# Patient Record
Sex: Male | Born: 1965 | Race: White | Hispanic: No | Marital: Single | Smoking: Never smoker
Health system: Southern US, Community
[De-identification: ages and names within clinical notes are randomized; demographics above are authoritative.]

## PROBLEM LIST (undated history)

## (undated) HISTORY — PX: CHOLECYSTECTOMY: SHX55

## (undated) HISTORY — PX: HAND SURGERY: SHX662

## (undated) HISTORY — PX: BACK SURGERY: SHX140

## (undated) HISTORY — PX: CLAVICLE SURGERY: SHX598

---

## 2014-08-04 ENCOUNTER — Encounter: Payer: Self-pay | Admitting: Sports Medicine

## 2014-08-04 ENCOUNTER — Ambulatory Visit (INDEPENDENT_AMBULATORY_CARE_PROVIDER_SITE_OTHER): Payer: BLUE CROSS/BLUE SHIELD | Admitting: Sports Medicine

## 2014-08-04 VITALS — BP 146/104 | HR 75 | Ht 71.0 in | Wt 215.0 lb

## 2014-08-04 DIAGNOSIS — M7742 Metatarsalgia, left foot: Secondary | ICD-10-CM

## 2014-08-04 DIAGNOSIS — M7741 Metatarsalgia, right foot: Secondary | ICD-10-CM | POA: Diagnosis not present

## 2014-08-04 DIAGNOSIS — M2141 Flat foot [pes planus] (acquired), right foot: Secondary | ICD-10-CM

## 2014-08-04 DIAGNOSIS — M2142 Flat foot [pes planus] (acquired), left foot: Secondary | ICD-10-CM

## 2014-08-04 NOTE — Progress Notes (Signed)
   Subjective:    Patient ID: Ian Allison, male    DOB: 06/21/1966, 48 y.o.   MRN: [id number]  HPI: Pt presents to clinic for bilateral foot pain at the suggestion of his chiropractor for eval for custom orthotics; he has never had custom orthotics, before. Pt reports he has had foot pain bilaterally and fallen arches "for years," which has been gradually worsening. He reports he has a very large bunion on his left foot which typically does not hurt unless he strikes it on something. He describes sore, sometimes stabbing pain in his feet bilaterally, in the midfoot, worse with walking or standing for long periods. He has tried no specific medications or exercises, but does use OTC shoe inserts with arch supports / built-in metatarsal pads, which do help some, but they wear out quickly. 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 He denies weakness / numbness in his feet, but states at the end of a "good" day that his feet feel like "someone beat them with a baseball bat."  Pt states he has no relevant PMH and takes no other medications. Surgical history notable for C6-C7 fracture fixation, bilateral distal clavical excisions, left thumb amputation and left hand surgery, as well as right leg surgery.  Review of Systems: As above. Generally feels well, otherwise.     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 and overlapping of second toe on weightbearing  No skin breakdown noted but calluses present over metatarsal heads bilaterally, L > R  Mild tenderness to palpation over metatarsal heads, left greater than right  No other  significant tenderness to direct palpation of feet throughout midfoot or over heels, bilaterally  Neurovascular:  Alert, oriented, normal gait / station  Strength 5/5 bilaterally with resisted dorsi- and plantarflexion  Sensation intact bilaterally to light touch and capillary refill normal bilaterally  Gait eval: slight in-toeing with pronation on walking gait, left foot greater than right     Assessment & Plan:  48yo male with bilateral pes planus and metatarsalgia - fitted for custom orthotics, today; see orthotics note below. - f/u in about 1 month for re-eval. Patient may want to have a second pair of orthotics made at that time.  Technical brewerCustom Orthotics Crafting Patient was fitted for a : standard, cushioned, semi-rigid orthotic. The orthotic was heated and afterward the patient stood on the orthotic blank positioned on the orthotic stand. The patient was positioned in subtalar neutral position and 10 degrees of ankle dorsiflexion in a weight bearing stance. After completion of molding, a stable base was applied to the orthotic blank. The blank was ground to a stable position for weight bearing. Size: 12 Base: blue EVA Posting: none Additional orthotic padding: bilateral metatarsal pads  Total time spent with the patient was 30 minutes with greater than 50% of the time spent in face to face consultation discussing orthotic construction, fitting, and instruction.  Bobbye Mortonhristopher M Amiliana Foutz, MD PGY-3, [facility] 08/04/2014, 3:54 PM

## 2014-09-03 ENCOUNTER — Ambulatory Visit (INDEPENDENT_AMBULATORY_CARE_PROVIDER_SITE_OTHER): Payer: BLUE CROSS/BLUE SHIELD | Admitting: Sports Medicine

## 2014-09-03 ENCOUNTER — Encounter: Payer: Self-pay | Admitting: Sports Medicine

## 2014-09-03 VITALS — BP 139/88 | HR 75

## 2014-09-03 DIAGNOSIS — M2142 Flat foot [pes planus] (acquired), left foot: Secondary | ICD-10-CM | POA: Diagnosis not present

## 2014-09-03 DIAGNOSIS — M2141 Flat foot [pes planus] (acquired), right foot: Secondary | ICD-10-CM | POA: Diagnosis not present

## 2014-09-04 NOTE — Progress Notes (Signed)
Patient ID: Ian Allison, male   DOB: 08/09/1966, 48 y.o.   MRN: [id number]  Patient comes in today for follow-up. Unfortunately he has found his custom orthotics to be uncomfortable. He is experiencing pain along the arch of each foot, left greater than right which she believes is due to the orthotics "not providing enough support". However, the orthotic has plenty of support and I think his discomfort may be coming from the orthotic itself. He has a pair of more flimsy orthotics which he has been wearing and finds them to be more comfortable and he believes that they provide more arch support than the custom orthotics we made. Therefore, I think we should try a pair of green sports insoles with scaphoid pads and metatarsal pads. We initially placed the scaphoid pad underneath the green insert but he felt like he wasn't getting enough support with this so we simply change this and added the scaphoid pad to the top of the insert. He found this to be much more comfortable and felt like it provided him with better support. He was also complaining that he could not get his custom orthotics into his other pairs of shoes which should not be the case with the more flexible green inserts. He will follow-up with me again in 3 weeks but I have encouraged him to call sooner if he continues to have problems.  Total time spent with the patient was 20 minutes with greater than 50% of the time spent in face-to-face consultation and orthotic revision.

## 2014-09-30 ENCOUNTER — Ambulatory Visit: Payer: BC Managed Care – PPO | Admitting: Sports Medicine

## 2015-06-11 ENCOUNTER — Encounter: Payer: Self-pay | Admitting: *Deleted

## 2015-06-11 ENCOUNTER — Emergency Department (INDEPENDENT_AMBULATORY_CARE_PROVIDER_SITE_OTHER): Payer: BLUE CROSS/BLUE SHIELD

## 2015-06-11 ENCOUNTER — Emergency Department (INDEPENDENT_AMBULATORY_CARE_PROVIDER_SITE_OTHER)
Admission: EM | Admit: 2015-06-11 | Discharge: 2015-06-11 | Disposition: A | Discharge status: Home / Self Care | Payer: BLUE CROSS/BLUE SHIELD | Source: Home / Self Care | Attending: Family Medicine | Admitting: Family Medicine

## 2015-06-11 DIAGNOSIS — S60221A Contusion of right hand, initial encounter: Secondary | ICD-10-CM

## 2015-06-11 DIAGNOSIS — S61411A Laceration without foreign body of right hand, initial encounter: Secondary | ICD-10-CM | POA: Diagnosis not present

## 2015-06-11 DIAGNOSIS — Z23 Encounter for immunization: Secondary | ICD-10-CM | POA: Diagnosis not present

## 2015-06-11 DIAGNOSIS — M79641 Pain in right hand: Secondary | ICD-10-CM | POA: Diagnosis not present

## 2015-06-11 MED ORDER — CEPHALEXIN 500 MG PO CAPS: 500.0000 mg | ORAL_CAPSULE | Freq: Three times a day (TID) | ORAL | Status: AC

## 2015-06-11 MED ORDER — HYDROCODONE-ACETAMINOPHEN 5-325 MG PO TABS: 1.0000 | ORAL_TABLET | Freq: Four times a day (QID) | ORAL | Status: AC | PRN

## 2015-06-11 MED ORDER — TETANUS-DIPHTH-ACELL PERTUSSIS 5-2.5-18.5 LF-MCG/0.5 IM SUSP
0.5000 mL | Freq: Once | INTRAMUSCULAR | Status: AC
  Administered 2015-06-11: 0.5 mL via INTRAMUSCULAR

## 2015-06-11 NOTE — Discharge Instructions (Signed)
Change dressing daily and apply Bacitracin ointment to wound.  Keep wound clean and dry.  Return for any signs of infection (or follow-up with family doctor):  Increasing redness, swelling, pain, heat, drainage, etc. Return in 12 days for suture removal.  Wear ace wrap on right hand daily until swelling resolves.  Elevate hand.   May take Ibuprofen , 4 tabs every 8 hours with food as needed for pain.  Apply ice pack to top of hand for 15 to 20 minutes, 3 to 4 times daily  Continue until swelling decreases.    Laceration Care, Adult A laceration is a cut or lesion that goes through all layers of the skin and into the tissue just beneath the skin. TREATMENT  Some lacerations may not require closure. Some lacerations may not be able to be closed due to an increased risk of infection. It is important to see your caregiver as soon as possible after an injury to minimize the risk of infection and maximize the opportunity for successful closure. If closure is appropriate, pain medicines may be given, if needed. The wound will be cleaned to help prevent infection. Your caregiver will use stitches (sutures), staples, wound glue (adhesive), or skin adhesive strips to repair the laceration. These tools bring the skin edges together to allow for faster healing and a better cosmetic outcome. However, all wounds will heal with a scar. Once the wound has healed, scarring can be minimized by covering the wound with sunscreen during the day for 1 full year. HOME CARE INSTRUCTIONS  For sutures or staples:  Keep the wound clean and dry.  If you were given a bandage (dressing), you should change it at least once a day. Also, change the dressing if it becomes wet or dirty, or as directed by your caregiver.  Wash the wound with soap and water 2 times a day. Rinse the wound off with water to remove all soap. Pat the wound dry with a clean towel.  After cleaning, apply a thin layer of the antibiotic ointment as  recommended by your caregiver. This will help prevent infection and keep the dressing from sticking.  You may shower as usual after the first 24 hours. Do not soak the wound in water until the sutures are removed.  Only take over-the-counter or prescription medicines for pain, discomfort, or fever as directed by your caregiver.  Get your sutures or staples removed as directed by your caregiver. For skin adhesive strips:  Keep the wound clean and dry.  Do not get the skin adhesive strips wet. You may bathe carefully, using caution to keep the wound dry.  If the wound gets wet, pat it dry with a clean towel.  Skin adhesive strips will fall off on their own. You may trim the strips as the wound heals. Do not remove skin adhesive strips that are still stuck to the wound. They will fall off in time. For wound adhesive:  You may briefly wet your wound in the shower or bath. Do not soak or scrub the wound. Do not swim. Avoid periods of heavy perspiration until the skin adhesive has fallen off on its own. After showering or bathing, gently pat the wound dry with a clean towel.  Do not apply liquid medicine, cream medicine, or ointment medicine to your wound while the skin adhesive is in place. This may loosen the film before your wound is healed.  If a dressing is placed over the wound, be careful not to apply tape  directly over the skin adhesive. This may cause the adhesive to be pulled off before the wound is healed.  Avoid prolonged exposure to sunlight or tanning lamps while the skin adhesive is in place. Exposure to ultraviolet light in the first year will darken the scar.  The skin adhesive will usually remain in place for 5 to 10 days, then naturally fall off the skin. Do not pick at the adhesive film. You may need a tetanus shot if:  You cannot remember when you had your last tetanus shot.  You have never had a tetanus shot. If you get a tetanus shot, your arm may swell, get red, and  feel warm to the touch. This is common and not a problem. If you need a tetanus shot and you choose not to have one, there is a rare chance of getting tetanus. Sickness from tetanus can be serious. SEEK MEDICAL CARE IF:   You have redness, swelling, or increasing pain in the wound.  You see a red line that goes away from the wound.  You have yellowish-white fluid (pus) coming from the wound.  You have a fever.  You notice a bad smell coming from the wound or dressing.  Your wound breaks open before or after sutures have been removed.  You notice something coming out of the wound such as wood or glass.  Your wound is on your hand or foot and you cannot move a finger or toe. SEEK IMMEDIATE MEDICAL CARE IF:   Your pain is not controlled with prescribed medicine.  You have severe swelling around the wound causing pain and numbness or a change in color in your arm, hand, leg, or foot.  Your wound splits open and starts bleeding.  You have worsening numbness, weakness, or loss of function of any joint around or beyond the wound.  You develop painful lumps near the wound or on the skin anywhere on your body. MAKE SURE YOU:   Understand these instructions.  Will watch your condition.  Will get help right away if you are not doing well or get worse. Document Released: 09/19/2005 Document Revised: 12/12/2011 Document Reviewed: 03/15/2011 [facility] Patient Information 2015 [place], [place]. This information is not intended to replace advice given to you by your health care provider. Make sure you discuss any questions you have with your health care provider.

## 2015-06-11 NOTE — ED Notes (Signed)
Pt reports cutting right hand on tractor blades while sharpening about 1 hour ago. Bleeding has stopped, cleaned with Hibiclens.

## 2015-06-11 NOTE — ED Provider Notes (Signed)
CSN: [id number]     Arrival date & time 06/11/15  1949 History   First MD Initiated Contact with Patient 06/11/15 1951     Chief Complaint  Patient presents with  . Laceration      HPI Comments: Patient was using a rotary grinder to sharpen a blade on his tractor mounted rotary mower.  He lost control of the grinder and the dorsum of his right hand struck the mower housing, while the mower blade lacerated his palm.  Patient is a 49 y.o. male presenting with skin laceration. The history is provided by the patient.  Laceration Location:  Hand Hand laceration location:  R palm Length (cm):  3.5 Depth:  Through underlying tissue (flap) Bleeding: controlled   Time since incident:  1 hour Laceration mechanism:  Metal edge Pain details:    Quality:  Aching   Severity:  Mild   Timing:  Constant   Progression:  Unchanged Foreign body present:  No foreign bodies Worsened by:  Movement Ineffective treatments:  None tried Tetanus status:  Out of date   History reviewed. No pertinent past medical history. Past Surgical History  Procedure Laterality Date  . Cholecystectomy    . Back surgery    . Clavicle surgery    . Hand surgery     History reviewed. No pertinent family history. Social History  Substance Use Topics  . Smoking status: Never Smoker   . Smokeless tobacco: None  . Alcohol Use: 0.0 oz/week    0 Standard drinks or equivalent per week    Review of Systems  All other systems reviewed and are negative.   Allergies  Review of patient's allergies indicates no known allergies.  Home Medications   Prior to Admission medications   Medication Sig Start Date End Date Taking? Authorizing Provider  ALPRAZolam Prudy Feeler) 1 MG tablet Take 1 mg by mouth. 09/01/14   Historical Provider, MD  amphetamine-dextroamphetamine (ADDERALL) 20 MG tablet Take 10 mg by mouth. 09/01/14   Historical Provider, MD  cephALEXin (KEFLEX) 500 MG capsule Take 1 capsule (500 mg total) by mouth 3  (three) times daily. 06/11/15   Lattie Haw, MD  cetirizine (ZYRTEC) 10 MG tablet Take 10 mg by mouth.    Historical Provider, MD  HYDROcodone-acetaminophen (NORCO/VICODIN) 5-325 MG per tablet Take 1 tablet by mouth every 6 (six) hours as needed for moderate pain. 06/11/15   Lattie Haw, MD  Multiple Vitamin (MULTIVITAMIN) capsule Take 1 capsule by mouth daily.    Historical Provider, MD  sildenafil (REVATIO) 20 MG tablet Take 3-5 tablets by mouth daily as needed for ED. 11/14/13   Historical Provider, MD   Meds Ordered and Administered this Visit   Medications  Tdap (BOOSTRIX) injection 0.5 mL (0.5 mLs Intramuscular Given 06/11/15 2016)    BP 145/97 mmHg  Pulse 112  Temp(Src) 98.8 F (37.1 C) (Oral)  Resp 16  SpO2 95% No data found.   Physical Exam  Constitutional: He is oriented to person, place, and time. He appears well-developed and well-nourished. No distress.  HENT:  Head: Atraumatic.  Eyes: Conjunctivae are normal. Pupils are equal, round, and reactive to light.  Musculoskeletal:       Right hand: He exhibits decreased range of motion, tenderness, bony tenderness, laceration and swelling. He exhibits normal two-point discrimination, normal capillary refill and no deformity. Normal sensation noted.       Hands: Right hand has curved 3.5cm flap laceration over the thenar eminence. Dorsum of right hand  has mild swelling and diffuse tenderness to palpation.  No ecchymosis.  Fingers have full range of motion and flexion/extension intact.  Distal neurovascular function is intact.   Neurological: He is alert and oriented to person, place, and time.  Skin: Skin is warm and dry.  Nursing note and vitals reviewed.   ED Course  Procedures Laceration Repair Discussed benefits and risks of procedure and verbal consent obtained. Using sterile technique and local 1% lidocaine without epinephrine, cleansed wound with Betadine followed by copious lavage with normal saline.  Wound  carefully inspected for debris and foreign bodies; none found.  Wound closed with #10, 4-0 interrupted nylon sutures.  Bacitracin and non-stick sterile dressing applied.  Wound precautions explained to patient.  Return for suture removal in 12 days.       Imaging Review Dg Hand Complete Right  06/11/2015   CLINICAL DATA:  Pt. Cut hand on tractor blades. Contusion to dorsum of RIGHT hand along with a deep laceration on the palmar surface around the 1st metacarpal.  EXAM: RIGHT HAND - COMPLETE 3+ VIEW  COMPARISON:  None.  FINDINGS: Evidence of prior fracture fifth metacarpal. No acute fracture or dislocation. No radiodense foreign body over the first digit.  IMPRESSION: No acute abnormalities   Electronically Signed   By: Esperanza Heir M.D.   On: 06/11/2015 20:22      MDM   1. Laceration of right hand, initial encounter   2. Contusion, hand, right, initial encounter    Tdap administered Ace wrap applied to left hand.  Begin empiric Keflex 500mg  TID.  Rx for Lortab. Change dressing daily and apply Bacitracin ointment to wound.  Keep wound clean and dry.  Return for any signs of infection (or follow-up with family doctor):  Increasing redness, swelling, pain, heat, drainage, etc. Return in 12 days for suture removal.  Wear ace wrap on right hand daily until swelling resolves.  Elevate hand.   May take Ibuprofen 200mg , 4 tabs every 8 hours with food as needed for pain.  Apply ice pack to top of hand for 15 to 20 minutes, 3 to 4 times daily  Continue until swelling decreases.    Lattie Haw, MD 06/13/15 [phone]

## 2015-06-14 ENCOUNTER — Telehealth: Payer: Self-pay | Admitting: Emergency Medicine

## 2015-06-25 ENCOUNTER — Emergency Department (INDEPENDENT_AMBULATORY_CARE_PROVIDER_SITE_OTHER)
Admission: EM | Admit: 2015-06-25 | Discharge: 2015-06-25 | Disposition: A | Discharge status: Home / Self Care | Payer: BLUE CROSS/BLUE SHIELD | Source: Home / Self Care | Attending: Family Medicine | Admitting: Family Medicine

## 2015-06-25 ENCOUNTER — Encounter: Payer: Self-pay | Admitting: Emergency Medicine

## 2015-06-25 DIAGNOSIS — Z4802 Encounter for removal of sutures: Secondary | ICD-10-CM

## 2015-06-25 NOTE — ED Notes (Signed)
Suture removal put in 2 weeks ago right hand no signs of infection

## 2015-06-25 NOTE — ED Provider Notes (Signed)
CSN: [id number]     Arrival date & time 06/25/15  1258 History   First MD Initiated Contact with Patient 06/25/15 1316     Chief Complaint  Patient presents with  . Suture / Staple Removal      HPI Comments: Patient returns for suture removal without complaints.  The history is provided by the patient.    History reviewed. No pertinent past medical history. Past Surgical History  Procedure Laterality Date  . Cholecystectomy    . Back surgery    . Clavicle surgery    . Hand surgery     No family history on file. Social History  Substance Use Topics  . Smoking status: Never Smoker   . Smokeless tobacco: None  . Alcohol Use: 0.0 oz/week    0 Standard drinks or equivalent per week    Review of Systems No fever.  No hand pain or swelling Allergies  Review of patient's allergies indicates not on file.  Home Medications   Prior to Admission medications   Medication Sig Start Date End Date Taking? Authorizing Provider  ALPRAZolam Prudy Feeler) 1 MG tablet Take 1 mg by mouth. 09/01/14   Historical Provider, MD  amphetamine-dextroamphetamine (ADDERALL) 20 MG tablet Take 10 mg by mouth. 09/01/14   Historical Provider, MD  cephALEXin (KEFLEX) 500 MG capsule Take 1 capsule (500 mg total) by mouth 3 (three) times daily. 06/11/15   Lattie Haw, MD  cetirizine (ZYRTEC) 10 MG tablet Take 10 mg by mouth.    Historical Provider, MD  HYDROcodone-acetaminophen (NORCO/VICODIN) 5-325 MG per tablet Take 1 tablet by mouth every 6 (six) hours as needed for moderate pain. 06/11/15   Lattie Haw, MD  Multiple Vitamin (MULTIVITAMIN) capsule Take 1 capsule by mouth daily.    Historical Provider, MD  sildenafil (REVATIO) 20 MG tablet Take 3-5 tablets by mouth daily as needed for ED. 11/14/13   Historical Provider, MD   Meds Ordered and Administered this Visit  Medications - No data to display  BP 137/94 mmHg  Pulse 75  Temp(Src) 97.9 F (36.6 C) (Oral)  SpO2 99% No data found.   Physical  Exam Right hand:  Healed laceration without swelling, drainage, tenderness, or erythema ED Course  Procedures  None   MDM   1. Visit for suture removal    Well healed laceration without evidence infection. Advised to use caution as scar tissue matures.     Lattie Haw, MD 06/25/15 [phone]

## 2015-06-25 NOTE — Discharge Instructions (Signed)

## 2016-12-07 IMAGING — CR DG HAND COMPLETE 3+V*R*
3 series · 3 of 3 positions shown · non-contrast
Comparison: None.

CLINICAL DATA: Pt. Cut hand on tractor blades. Contusion to dorsum
of RIGHT hand along with a deep laceration on the palmar surface
around the 1st metacarpal.

EXAM:
RIGHT HAND - COMPLETE 3+ VIEW

[hand pa]
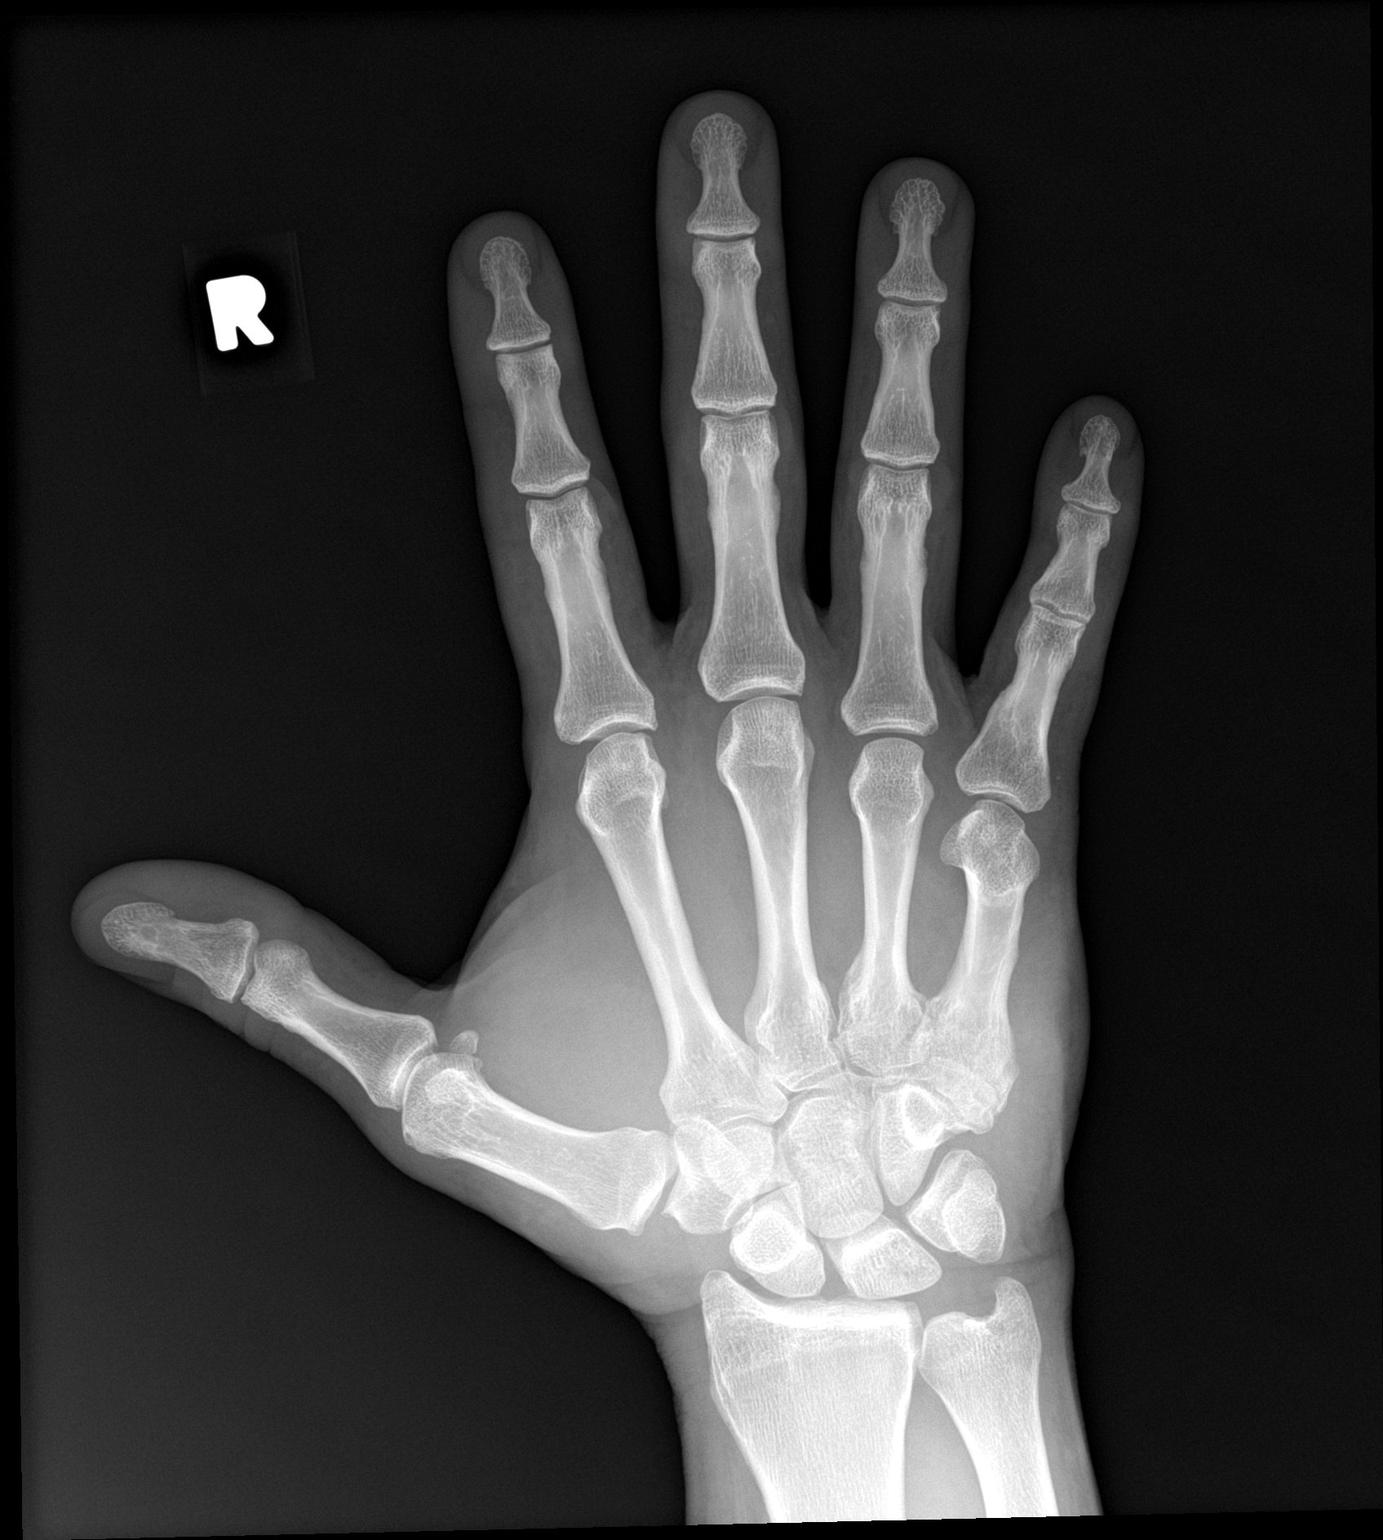

[hand obl]
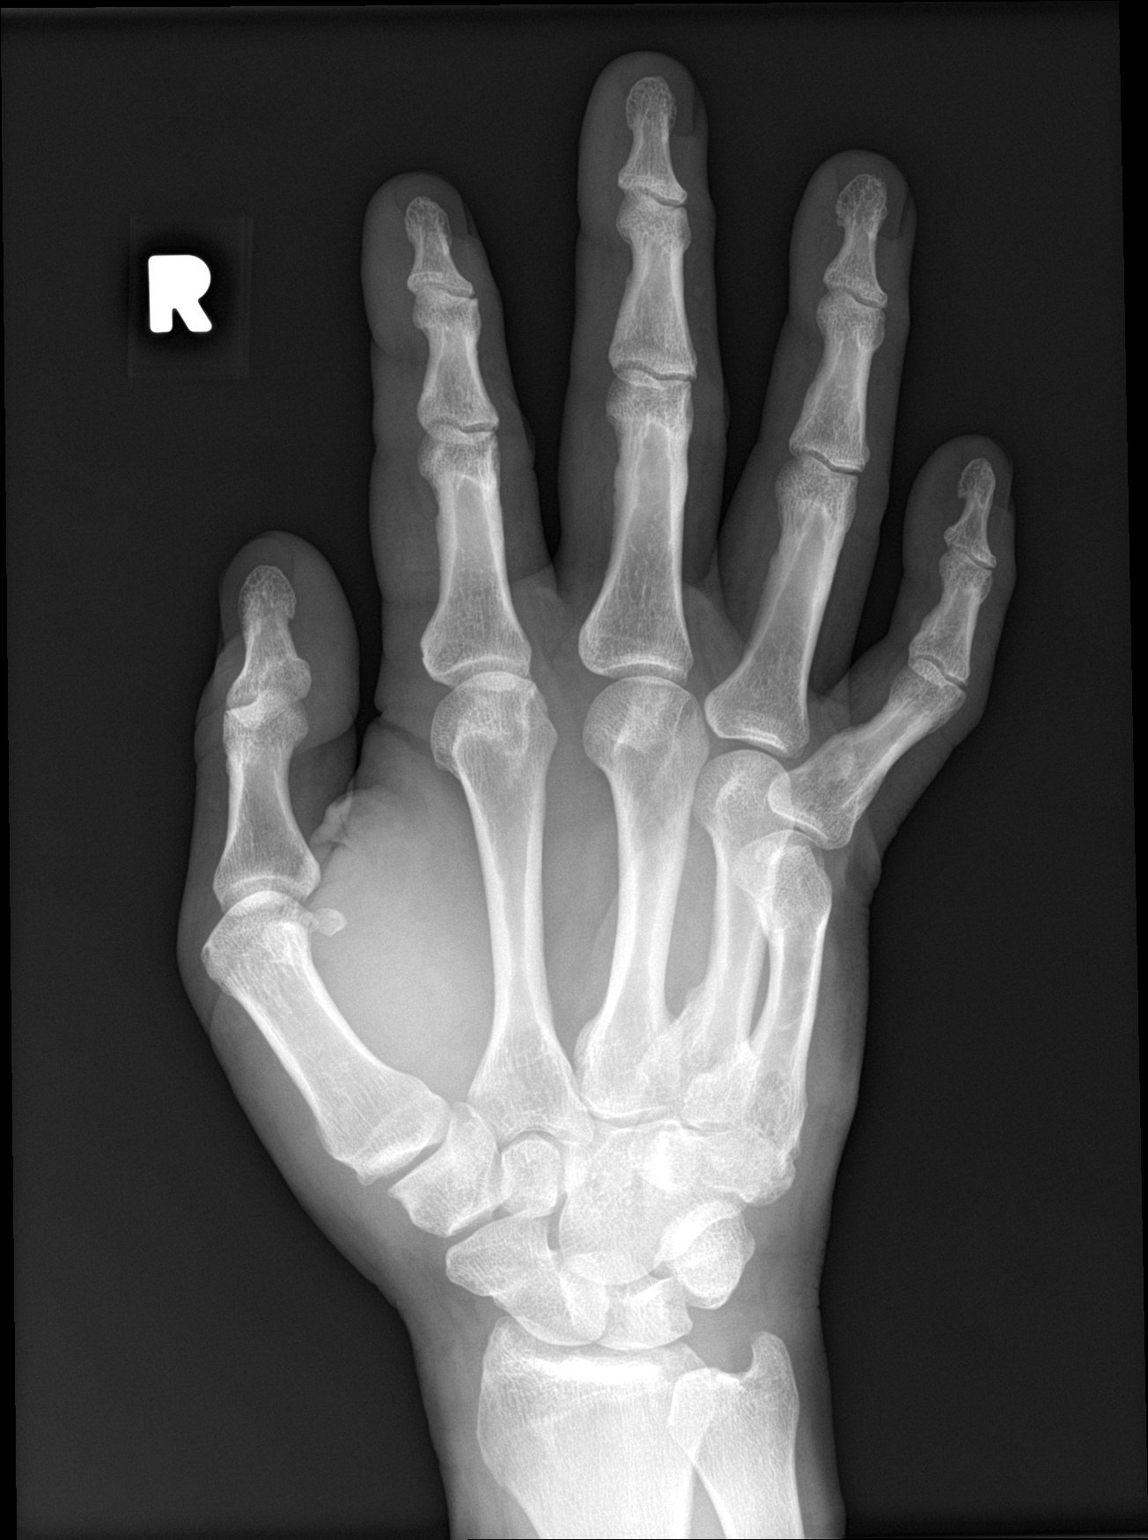

[hand lat]
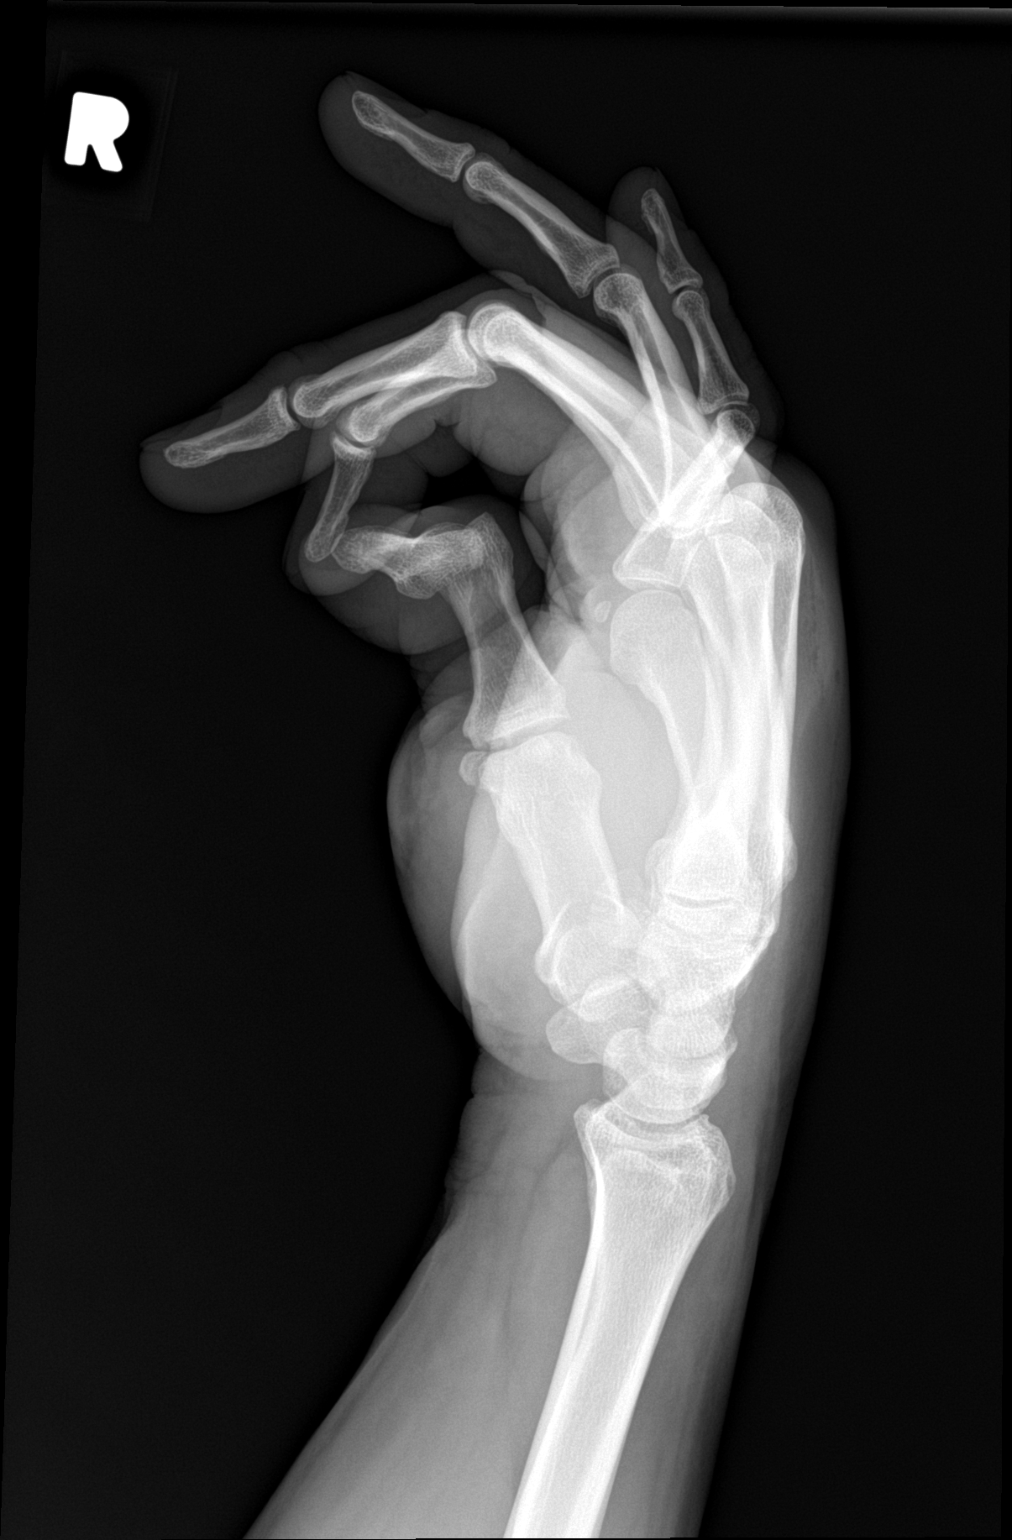

[3 of 3 positions shown; findings below may reference images not displayed]

FINDINGS: Evidence of prior fracture fifth metacarpal. No acute fracture or
dislocation. No radiodense foreign body over the first digit.
IMPRESSION: No acute abnormalities
